# Patient Record
Sex: Male | Born: 1985 | Race: Black or African American | Hispanic: No | Marital: Single | Smoking: Current every day smoker
Health system: Southern US, Community
[De-identification: ages and names within clinical notes are randomized; demographics above are authoritative.]

---

## 2018-12-21 ENCOUNTER — Emergency Department (HOSPITAL_COMMUNITY): Payer: HRSA Program

## 2018-12-21 ENCOUNTER — Emergency Department (HOSPITAL_COMMUNITY)
Admission: EM | Admit: 2018-12-21 | Discharge: 2018-12-21 | Disposition: A | Discharge status: Home / Self Care | Payer: HRSA Program | Attending: Emergency Medicine | Admitting: Emergency Medicine

## 2018-12-21 ENCOUNTER — Encounter (HOSPITAL_COMMUNITY): Payer: Self-pay

## 2018-12-21 ENCOUNTER — Other Ambulatory Visit: Payer: Self-pay

## 2018-12-21 DIAGNOSIS — M545 Low back pain: Secondary | ICD-10-CM | POA: Diagnosis not present

## 2018-12-21 DIAGNOSIS — R509 Fever, unspecified: Secondary | ICD-10-CM | POA: Diagnosis not present

## 2018-12-21 DIAGNOSIS — M791 Myalgia, unspecified site: Secondary | ICD-10-CM | POA: Diagnosis present

## 2018-12-21 DIAGNOSIS — Z79899 Other long term (current) drug therapy: Secondary | ICD-10-CM | POA: Diagnosis not present

## 2018-12-21 DIAGNOSIS — B349 Viral infection, unspecified: Secondary | ICD-10-CM | POA: Diagnosis not present

## 2018-12-21 DIAGNOSIS — Z20828 Contact with and (suspected) exposure to other viral communicable diseases: Secondary | ICD-10-CM | POA: Insufficient documentation

## 2018-12-21 LAB — URINALYSIS, ROUTINE W REFLEX MICROSCOPIC
Bacteria, UA: NONE SEEN
Bilirubin Urine: NEGATIVE
Glucose, UA: NEGATIVE mg/dL
Hgb urine dipstick: NEGATIVE
Ketones, ur: NEGATIVE mg/dL
Leukocytes,Ua: NEGATIVE
Nitrite: NEGATIVE
Protein, ur: 100 mg/dL — AB
Specific Gravity, Urine: 1.019 (ref 1.005–1.030)
pH: 5 (ref 5.0–8.0)

## 2018-12-21 MED ORDER — ACETAMINOPHEN 325 MG PO TABS
650.0000 mg | ORAL_TABLET | Freq: Once | ORAL | Status: AC
  Administered 2018-12-21: 01:00:00 650 mg via ORAL
  Filled 2018-12-21: qty 2

## 2018-12-21 NOTE — ED Provider Notes (Signed)
[facility] DEPT Provider Note   CSN: [id number] Arrival date & time: 12/21/18  0006    History   Chief Complaint Chief Complaint  Patient presents with  . possible covid    HPI Marco Snyder is a 33 y.o. male without significant past medical history, presenting to the emergency department with complaint of generalized body aches and fever since Tuesday of this week.  He states he has had generalized body aches, including his low back.  He has had chills, sore throat, diarrhea.  He has had increased urinary frequency, however has been drinking lots of water.  He denies cough, shortness of breath, vomiting, abdominal pain, loss of taste or smell, flank pain.  He has no known sick contacts or any contact with people that tested positive for COVID that he knows of.  He works at [employer].  He has been treating symptoms with TheraFlu.     The history is provided by the patient.    History reviewed. No pertinent past medical history.  There are no active problems to display for this patient.   History reviewed. No pertinent surgical history.      Home Medications    Prior to Admission medications   Medication Sig Start Date End Date Taking? Authorizing Provider  acetaminophen (TYLENOL) 500 MG tablet Take 1,000 mg by mouth every 6 (six) hours as needed for moderate pain.   Yes [provider]  buPROPion (WELLBUTRIN XL) 300 MG 24 hr tablet Take 300 mg by mouth daily.   Yes [provider]  diphenhydrAMINE-PE-APAP (THERAFLU WARMING RELIEF FLU) 12.5-5-325 MG/15ML LIQD Take 30 mLs by mouth every 6 (six) hours as needed (cough).   Yes [provider]  divalproex (DEPAKOTE ER) 500 MG 24 hr tablet Take 500 mg by mouth daily.   Yes [provider]  Phenyleph-Doxylamine-DM-APAP (NYQUIL SEVERE COLD/FLU) 5-6.25-10-325 MG/15ML LIQD Take 30 mLs by mouth at bedtime as needed (sleep).   Yes [provider]    Family  History No family history on file.  Social History Social History   Tobacco Use  . Smoking status: Not on file  Substance Use Topics  . Alcohol use: Not on file  . Drug use: Not on file     Allergies   Patient has no known allergies.   Review of Systems Review of Systems  Constitutional: Positive for chills and fever.  Respiratory: Negative for cough and shortness of breath.   Gastrointestinal: Positive for diarrhea. Negative for abdominal pain and vomiting.  Genitourinary: Positive for frequency. Negative for dysuria, flank pain and hematuria.  Musculoskeletal: Positive for myalgias (Generalized). Negative for neck pain and neck stiffness.  All other systems reviewed and are negative.    Physical Exam Updated Vital Signs BP 111/78   Pulse 73   Temp (!) 100.5 F (38.1 C) (Oral)   Resp 19   Ht 5\' 10"  (1.778 m)   Wt 79.4 kg   SpO2 96%   BMI 25.11 kg/m   Physical Exam Vitals signs and nursing note reviewed.  Constitutional:      General: He is not in acute distress.    Appearance: He is well-developed. He is not ill-appearing.     Comments: Patient is well-appearing, sitting up resting comfortably in bed.  HENT:     Head: Normocephalic and atraumatic.  Eyes:     Conjunctiva/sclera: Conjunctivae normal.  Neck:     Musculoskeletal: Normal range of motion.  Cardiovascular:  Rate and Rhythm: Normal rate and regular rhythm.  Pulmonary:     Effort: Pulmonary effort is normal. No respiratory distress.     Comments: Respirations are even and unlabored.  Patient is speaking in full sentences. Abdominal:     Palpations: Abdomen is soft.  Skin:    General: Skin is warm.  Neurological:     Mental Status: He is alert.  Psychiatric:        Behavior: Behavior normal.      ED Treatments / Results  Labs (all labs ordered are listed, but only abnormal results are displayed) Labs Reviewed  URINALYSIS, ROUTINE W REFLEX MICROSCOPIC - Abnormal; Notable for the  following components:      Result Value   Protein, ur 100 (*)    All other components within normal limits  NOVEL CORONAVIRUS, NAA (HOSPITAL ORDER, SEND-OUT TO REF LAB)    EKG None  Radiology Dg Chest Port 1 View  Result Date: 12/21/2018 CLINICAL DATA:  Chills, body aches, weakness EXAM: PORTABLE CHEST 1 VIEW COMPARISON:  None. FINDINGS: Heart and mediastinal contours are within normal limits. No focal opacities or effusions. No acute bony abnormality. IMPRESSION: No active disease. Electronically Signed   By: Charlett NoseKevin  Dover M.D.   On: 12/21/2018 01:01    Procedures Procedures (including critical care time)  Medications Ordered in ED Medications  acetaminophen (TYLENOL) tablet 650 mg (650 mg Oral Given 12/21/18 0123)     Initial Impression / Assessment and Plan / ED Course  I have reviewed the triage vital signs and the nursing notes.  Pertinent labs & imaging results that were available during my care of the patient were reviewed by me and considered in my medical decision making (see chart for details).    [name] was evaluated in Emergency Department on 12/21/2018 for the symptoms described in the history of present illness. He was evaluated in the context of the global COVID-19 pandemic, which necessitated consideration that the patient might be at risk for infection with the SARS-CoV-2 virus that causes COVID-19. Institutional protocols and algorithms that pertain to the evaluation of patients at risk for COVID-19 are in a state of rapid change based on information released by regulatory bodies including the CDC and federal and state organizations. These policies and algorithms were followed during the patient's care in the ED.     Pt presenting with body aches, fever, sore throat since Tuesday. No cough or SOB. No abd pain. Pt is well-appearing and in no distress. VSS. UA neg for infxn. CXR neg for infiltrate. COVID swab sent with concern for possible COVID infection.  Recommend symptomatic management and home isolation.  Discussed results, findings, treatment and follow up. Patient advised of return precautions. Patient verbalized understanding and agreed with plan.  Final Clinical Impressions(s) / ED Diagnoses   Final diagnoses:  Viral illness    ED Discharge Orders    None       Clio Gerhart, [place] N, PA-C 12/21/18 0445    Paula LibraMolpus, John, MD 12/21/18 [phone]

## 2018-12-21 NOTE — ED Triage Notes (Signed)
Per EMS, Pt began having flu/Covid like symptoms starting last Tuesday. Pt endorsees chills/sweating, generalized body aches, headaches, generalized weakness, diarrhea, sore throat. Pt denies SOB, or vomiting. Pt also complaining of right flank pain, stating that he has had increased urination, and urine was dark in color.

## 2018-12-21 NOTE — Discharge Instructions (Addendum)
Please read instructions below.  Your chest xray and urine are normal. You can take tylenol or ibuprofen as needed for body aches, sore throat or fever.  Drink plenty of water.  Use saline nasal spray for congestion. Follow up with your primary care provider as needed if symptoms persist. You will need to isolate at home until you know your covid test results. Return to the ER for inability to swallow liquids, difficulty breathing, or new or worsening symptoms.      Person Under Monitoring Name: Marco Snyder  Location: [address] [place] [address]   Infection Prevention Recommendations for Individuals Confirmed to have, or Being Evaluated for, 2019 Novel Coronavirus (COVID-19) Infection Who Receive Care at Home  Individuals who are confirmed to have, or are being evaluated for, COVID-19 should follow the prevention steps below until a healthcare provider or local or state health department says they can return to normal activities.  Stay home except to get medical care You should restrict activities outside your home, except for getting medical care. Do not go to work, school, or public areas, and do not use public transportation or taxis.  Call ahead before visiting your doctor Before your medical appointment, call the healthcare provider and tell them that you have, or are being evaluated for, COVID-19 infection. This will help the healthcare providers office take steps to keep other people from getting infected. Ask your healthcare provider to call the local or state health department.  Monitor your symptoms Seek prompt medical attention if your illness is worsening (e.g., difficulty breathing). Before going to your medical appointment, call the healthcare provider and tell them that you have, or are being evaluated for, COVID-19 infection. Ask your healthcare provider to call the local or state health department.  Wear a facemask You should wear a  facemask that covers your nose and mouth when you are in the same room with other people and when you visit a healthcare provider. People who live with or visit you should also wear a facemask while they are in the same room with you.  Separate yourself from other people in your home As much as possible, you should stay in a different room from other people in your home. Also, you should use a separate bathroom, if available.  Avoid sharing household items You should not share dishes, drinking glasses, cups, eating utensils, towels, bedding, or other items with other people in your home. After using these items, you should wash them thoroughly with soap and water.  Cover your coughs and sneezes Cover your mouth and nose with a tissue when you cough or sneeze, or you can cough or sneeze into your sleeve. Throw used tissues in a lined trash can, and immediately wash your hands with soap and water for at least 20 seconds or use an alcohol-based hand rub.  Wash your [facility] your hands often and thoroughly with soap and water for at least 20 seconds. You can use an alcohol-based hand sanitizer if soap and water are not available and if your hands are not visibly dirty. Avoid touching your eyes, nose, and mouth with unwashed hands.   Prevention Steps for Caregivers and Household Members of Individuals Confirmed to have, or Being Evaluated for, COVID-19 Infection Being Cared for in the Home  If you live with, or provide care at home for, a person confirmed to have, or being evaluated for, COVID-19 infection please follow these guidelines to prevent infection:  Follow healthcare  providers instructions Make sure that you understand and can help the patient follow any healthcare provider instructions for all care.  Provide for the patients basic needs You should help the patient with basic needs in the home and provide support for getting groceries, prescriptions, and other personal  needs.  Monitor the patients symptoms If they are getting sicker, call his or her medical provider and tell them that the patient has, or is being evaluated for, COVID-19 infection. This will help the healthcare providers office take steps to keep other people from getting infected. Ask the healthcare provider to call the local or state health department.  Limit the number of people who have contact with the patient If possible, have only one caregiver for the patient. Other household members should stay in another home or place of residence. If this is not possible, they should stay in another room, or be separated from the patient as much as possible. Use a separate bathroom, if available. Restrict visitors who do not have an essential need to be in the home.  Keep older adults, very young children, and other sick people away from the patient Keep older adults, very young children, and those who have compromised immune systems or chronic health conditions away from the patient. This includes people with chronic heart, lung, or kidney conditions, diabetes, and cancer.  Ensure good ventilation Make sure that shared spaces in the home have good air flow, such as from an air conditioner or an opened window, weather permitting.  Wash your hands often Wash your hands often and thoroughly with soap and water for at least 20 seconds. You can use an alcohol based hand sanitizer if soap and water are not available and if your hands are not visibly dirty. Avoid touching your eyes, nose, and mouth with unwashed hands. Use disposable paper towels to dry your hands. If not available, use dedicated cloth towels and replace them when they become wet.  Wear a facemask and gloves Wear a disposable facemask at all times in the room and gloves when you touch or have contact with the patients blood, body fluids, and/or secretions or excretions, such as sweat, saliva, sputum, nasal mucus, vomit, urine, or  feces.  Ensure the mask fits over your nose and mouth tightly, and do not touch it during use. Throw out disposable facemasks and gloves after using them. Do not reuse. Wash your hands immediately after removing your facemask and gloves. If your personal clothing becomes contaminated, carefully remove clothing and launder. Wash your hands after handling contaminated clothing. Place all used disposable facemasks, gloves, and other waste in a lined container before disposing them with other household waste. Remove gloves and wash your hands immediately after handling these items.  Do not share dishes, glasses, or other household items with the patient Avoid sharing household items. You should not share dishes, drinking glasses, cups, eating utensils, towels, bedding, or other items with a patient who is confirmed to have, or being evaluated for, COVID-19 infection. After the person uses these items, you should wash them thoroughly with soap and water.  Wash laundry thoroughly Immediately remove and wash clothes or bedding that have blood, body fluids, and/or secretions or excretions, such as sweat, saliva, sputum, nasal mucus, vomit, urine, or feces, on them. Wear gloves when handling laundry from the patient. Read and follow directions on labels of laundry or clothing items and detergent. In general, wash and dry with the warmest temperatures recommended on the label.  Clean all  areas the individual has used often Clean all touchable surfaces, such as counters, tabletops, doorknobs, bathroom fixtures, toilets, phones, keyboards, tablets, and bedside tables, every day. Also, clean any surfaces that may have blood, body fluids, and/or secretions or excretions on them. Wear gloves when cleaning surfaces the patient has come in contact with. Use a diluted bleach solution (e.g., dilute bleach with 1 part bleach and 10 parts water) or a household disinfectant with a label that says EPA-registered for  coronaviruses. To make a bleach solution at home, add 1 tablespoon of bleach to 1 quart (4 cups) of water. For a larger supply, add  cup of bleach to 1 gallon (16 cups) of water. Read labels of cleaning products and follow recommendations provided on product labels. Labels contain instructions for safe and effective use of the cleaning product including precautions you should take when applying the product, such as wearing gloves or eye protection and making sure you have good ventilation during use of the product. Remove gloves and wash hands immediately after cleaning.  Monitor yourself for signs and symptoms of illness Caregivers and household members are considered close contacts, should monitor their health, and will be asked to limit movement outside of the home to the extent possible. Follow the monitoring steps for close contacts listed on the symptom monitoring form.   ? If you have additional questions, contact your local health department or call the epidemiologist on call at [phone] (available 24/7). ? This guidance is subject to change. For the most up-to-date guidance from [facility], please refer to their website: TripMetro.huhttps://www.cdc.gov/coronavirus/2019-ncov/hcp/guidance-prevent-spread.html

## 2018-12-22 LAB — NOVEL CORONAVIRUS, NAA (HOSP ORDER, SEND-OUT TO REF LAB; TAT 18-24 HRS): SARS-CoV-2, NAA: NOT DETECTED

## 2020-08-21 ENCOUNTER — Ambulatory Visit (HOSPITAL_COMMUNITY): Admission: EM | Admit: 2020-08-21 | Discharge: 2020-08-21 | Discharge status: Against Medical Advice | Payer: Self-pay

## 2020-08-21 ENCOUNTER — Other Ambulatory Visit: Payer: Self-pay

## 2020-11-26 ENCOUNTER — Encounter (HOSPITAL_COMMUNITY): Payer: Self-pay

## 2020-11-26 ENCOUNTER — Ambulatory Visit (HOSPITAL_COMMUNITY)
Admission: EM | Admit: 2020-11-26 | Discharge: 2020-11-26 | Disposition: A | Discharge status: Home / Self Care | Payer: Self-pay | Attending: Emergency Medicine | Admitting: Emergency Medicine

## 2020-11-26 ENCOUNTER — Other Ambulatory Visit: Payer: Self-pay

## 2020-11-26 DIAGNOSIS — L03011 Cellulitis of right finger: Secondary | ICD-10-CM

## 2020-11-26 DIAGNOSIS — R21 Rash and other nonspecific skin eruption: Secondary | ICD-10-CM

## 2020-11-26 MED ORDER — DOXYCYCLINE HYCLATE 100 MG PO CAPS: 100.0000 mg | ORAL_CAPSULE | Freq: Two times a day (BID) | ORAL | 0 refills | Status: AC

## 2020-11-26 MED ORDER — TRIAMCINOLONE ACETONIDE 0.1 % EX CREA: 1.0000 "application " | TOPICAL_CREAM | Freq: Two times a day (BID) | CUTANEOUS | 0 refills | Status: AC

## 2020-11-26 NOTE — ED Triage Notes (Signed)
Pt reports swelling and pain in the right pinky finger x 1 week. Reports he had some pus came out after he drained yesterday.   Pt reports rash in the back x 6 months.

## 2020-11-26 NOTE — ED Provider Notes (Signed)
[facility]    CSN: [id number] Arrival date & time: 11/26/20  1224      History   Chief Complaint Chief Complaint  Patient presents with   finger problem    HPI Marco Snyder is a 35 y.o. male.   Patient here for evaluation of right pinky finger swelling and redness that has been ongoing for the past week.  Reports getting up and getting some drainage yesterday but swelling was worse today.  Has not tried any other OTC medications or treatments.  Also reports rash on his back that has been ongoing for the past 6 months.  Reports rash is itchy but denies any pain.  Denies any trauma, injury, or other precipitating event.  Denies any specific alleviating or aggravating factors.  Denies any fevers, chest pain, shortness of breath, N/V/D, numbness, tingling, weakness, abdominal pain, or headaches.     The history is provided by the patient.   History reviewed. No pertinent past medical history.  There are no problems to display for this patient.   History reviewed. No pertinent surgical history.     Home Medications    Prior to Admission medications   Medication Sig Start Date End Date Taking? Authorizing Provider  doxycycline (VIBRAMYCIN) 100 MG capsule Take 1 capsule (100 mg total) by mouth 2 (two) times daily. 11/26/20  Yes Ivette Loyal, NP  triamcinolone cream (KENALOG) 0.1 % Apply 1 application topically 2 (two) times daily. 11/26/20  Yes Ivette Loyal, NP  acetaminophen (TYLENOL) 500 MG tablet Take 1,000 mg by mouth every 6 (six) hours as needed for moderate pain.    [provider]  buPROPion (WELLBUTRIN XL) 300 MG 24 hr tablet Take 300 mg by mouth daily.    [provider]  diphenhydrAMINE-PE-APAP (THERAFLU WARMING RELIEF FLU) 12.5-5-325 MG/15ML LIQD Take 30 mLs by mouth every 6 (six) hours as needed (cough).    [provider]  divalproex (DEPAKOTE ER) 500 MG 24 hr tablet Take 500 mg by mouth daily.    [provider]   Phenyleph-Doxylamine-DM-APAP (NYQUIL SEVERE COLD/FLU) 5-6.25-10-325 MG/15ML LIQD Take 30 mLs by mouth at bedtime as needed (sleep).    [provider]    Family History Family History  Problem Relation Age of Onset   Cancer Mother    Healthy Father     Social History Social History   Tobacco Use   Smoking status: Every Day    Packs/day: 1.00    Pack years: 0.00    Types: Cigarettes   Smokeless tobacco: Never  Vaping Use   Vaping Use: Never used  Substance Use Topics   Alcohol use: Yes   Drug use: Yes    Frequency: 7.0 times per week    Types: Marijuana     Allergies   Patient has no known allergies.   Review of Systems Review of Systems  Skin:  Positive for rash and wound.  All other systems reviewed and are negative.   Physical Exam Triage Vital Signs ED Triage Vitals  Enc Vitals Group     BP 11/26/20 1340 121/76     Pulse --      Resp 11/26/20 1340 16     Temp 11/26/20 1340 98.6 F (37 C)     Temp Source 11/26/20 1340 Oral     SpO2 11/26/20 1340 100 %     Weight --      Height --      Head Circumference --  Peak Flow --      Pain Score 11/26/20 1337 9     Pain Loc --      Pain Edu? --      Excl. in GC? --    No data found.  Updated Vital Signs BP 121/76 (BP Location: Right Arm)   Temp 98.6 F (37 C) (Oral)   Resp 16   SpO2 100%   Visual Acuity Right Eye Distance:   Left Eye Distance:   Bilateral Distance:    Right Eye Near:   Left Eye Near:    Bilateral Near:     Physical Exam Vitals and nursing note reviewed.  Constitutional:      General: He is not in acute distress.    Appearance: Normal appearance. He is not ill-appearing, toxic-appearing or diaphoretic.  HENT:     Head: Normocephalic and atraumatic.  Eyes:     Conjunctiva/sclera: Conjunctivae normal.  Cardiovascular:     Rate and Rhythm: Normal rate.     Pulses: Normal pulses.  Pulmonary:     Effort: Pulmonary effort is normal.  Abdominal:     General:  Abdomen is flat.  Musculoskeletal:        General: Normal range of motion.     Cervical back: Normal range of motion.  Skin:    General: Skin is warm and dry.     Findings: Abscess (right little finger with redness and swelling around nail) and rash present. Rash is vesicular (diffuse across back).  Neurological:     General: No focal deficit present.     Mental Status: He is alert and oriented to person, place, and time.  Psychiatric:        Mood and Affect: Mood normal.   Back:    Left little finger:     UC Treatments / Results  Labs (all labs ordered are listed, but only abnormal results are displayed) Labs Reviewed - No data to display  EKG   Radiology No results found.  Procedures Procedures (including critical care time)  Medications Ordered in UC Medications - No data to display  Initial Impression / Assessment and Plan / UC Course  I have reviewed the triage vital signs and the nursing notes.  Pertinent labs & imaging results that were available during my care of the patient were reviewed by me and considered in my medical decision making (see chart for details).    Assessment negative for red flags or concerns.  Paronychia of right little finger.  Doxycycline twice daily for the next 10 days.  Soak finger in warm water or Epsom salt soaks 3-4 times a day.  Patient instructed to return for any worsening symptoms such as worsening redness, swelling, red streaks, or development of fevers. Rash and nonspecific skin eruption of the back.  Apply Kenalog cream twice a day as needed for itching.  May use emollient lotion such as CeraVe, Cetaphil, or Aquaphor.  Recommend applying lotions to wet skin and avoiding hot water when showering or bathing.  Follow-up with primary care as needed. Final Clinical Impressions(s) / UC Diagnoses   Final diagnoses:  Paronychia of finger of right hand  Rash and nonspecific skin eruption     Discharge Instructions      For your  finger: Take the doxycycline 1 pill twice a day for the next 10 days. Soak your finger in warm salt water or in Epsom salt soak 3-4 times a day. If the swelling or redness gets any worse please  return for reevaluation.  For your back: Apply the Kenalog cream twice a day as needed for itching.  If rash does not improve he may return for reevaluation.   Return or go to the Emergency Department if symptoms worsen or do not improve in the next few days.      ED Prescriptions     Medication Sig Dispense Auth. Provider   doxycycline (VIBRAMYCIN) 100 MG capsule Take 1 capsule (100 mg total) by mouth 2 (two) times daily. 20 capsule Ivette Loyal, NP   triamcinolone cream (KENALOG) 0.1 % Apply 1 application topically 2 (two) times daily. 30 g Ivette Loyal, NP      PDMP not reviewed this encounter.   Ivette Loyal, NP 11/26/20 1451

## 2020-11-26 NOTE — Discharge Instructions (Addendum)
For your finger: Take the doxycycline 1 pill twice a day for the next 10 days. Soak your finger in warm salt water or in Epsom salt soak 3-4 times a day. If the swelling or redness gets any worse please return for reevaluation.  For your back: Apply the Kenalog cream twice a day as needed for itching.  If rash does not improve he may return for reevaluation.   Return or go to the Emergency Department if symptoms worsen or do not improve in the next few days.

## 2021-03-05 IMAGING — DX PORTABLE CHEST - 1 VIEW
1 series · 1 of 1 positions shown · non-contrast
Comparison: None.

CLINICAL DATA: Chills, body aches, weakness

EXAM:
PORTABLE CHEST 1 VIEW

[chest ap]
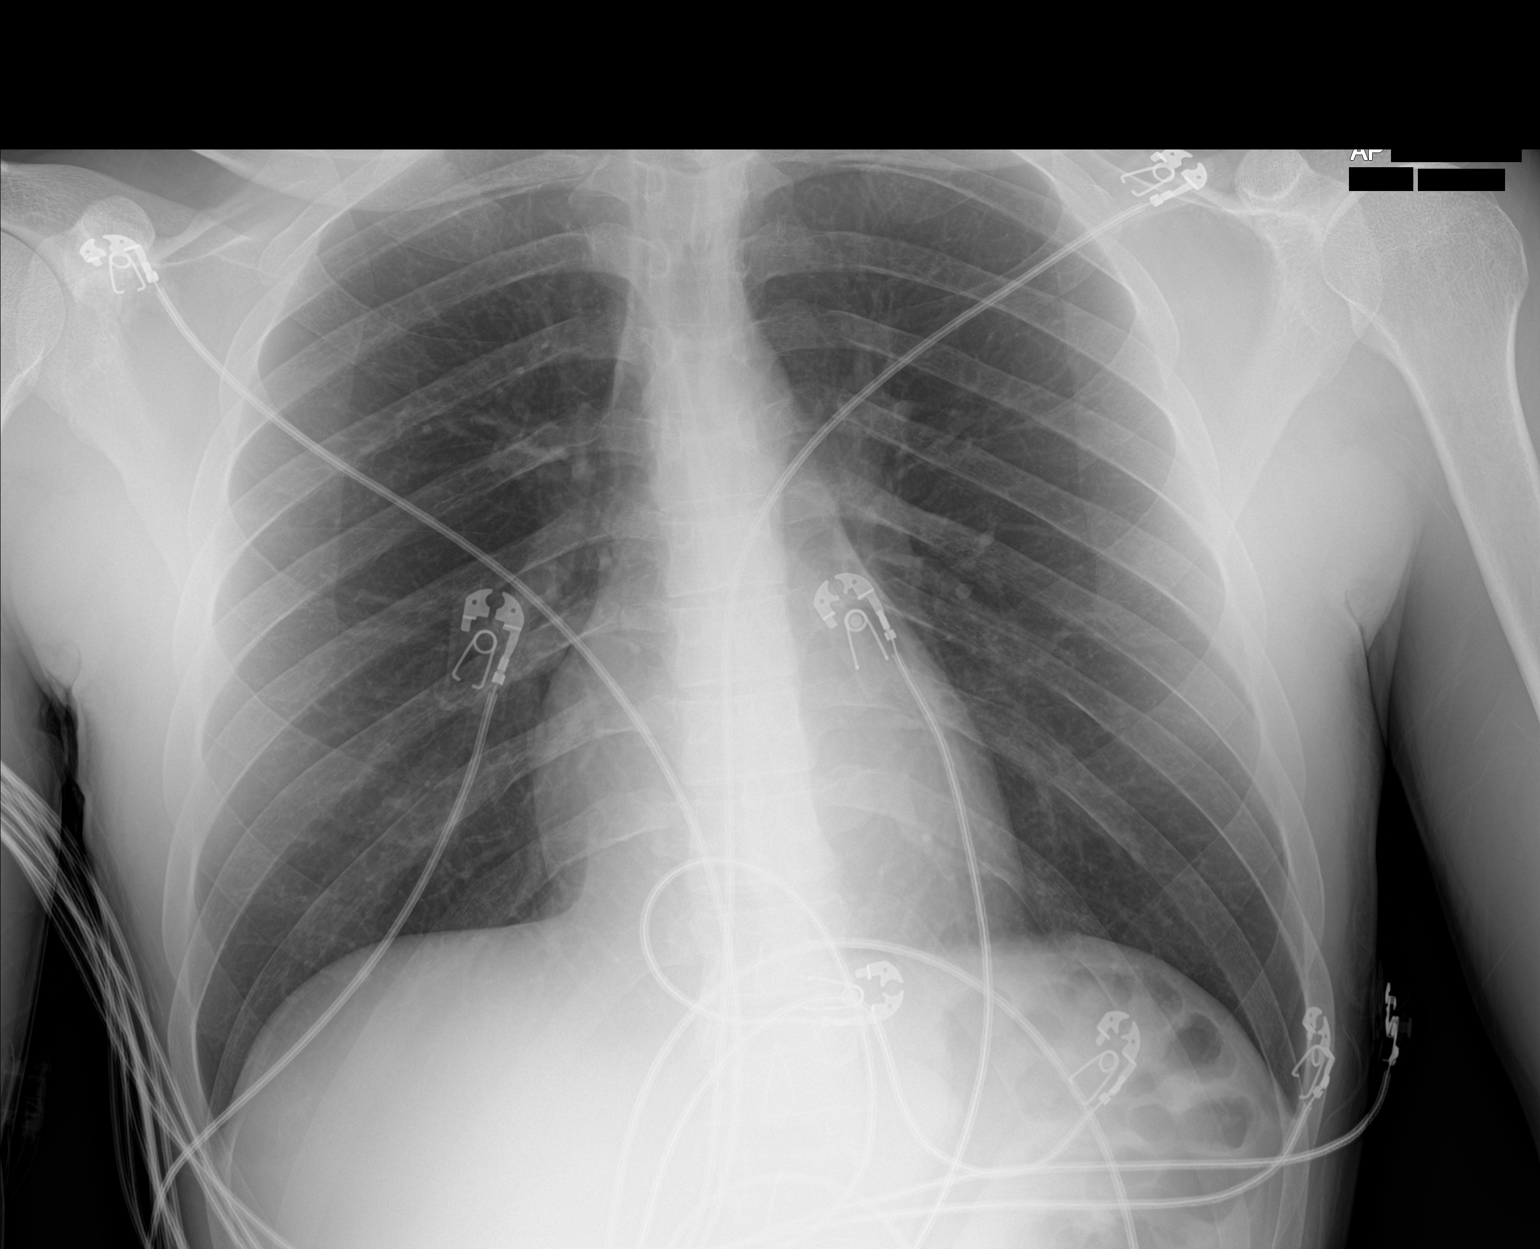

[1 of 1 positions shown; findings below may reference images not displayed]

FINDINGS: Heart and mediastinal contours are within normal limits. No focal
opacities or effusions. No acute bony abnormality.
IMPRESSION: No active disease.

## 2021-04-25 ENCOUNTER — Encounter (HOSPITAL_COMMUNITY): Payer: Self-pay | Admitting: Emergency Medicine

## 2021-04-25 ENCOUNTER — Other Ambulatory Visit: Payer: Self-pay

## 2021-04-25 ENCOUNTER — Ambulatory Visit (HOSPITAL_COMMUNITY)
Admission: EM | Admit: 2021-04-25 | Discharge: 2021-04-25 | Disposition: A | Discharge status: Home / Self Care | Payer: Medicaid Other | Attending: Emergency Medicine | Admitting: Emergency Medicine

## 2021-04-25 DIAGNOSIS — J111 Influenza due to unidentified influenza virus with other respiratory manifestations: Secondary | ICD-10-CM

## 2021-04-25 MED ORDER — CYCLOBENZAPRINE HCL 5 MG PO TABS: 5.0000 mg | ORAL_TABLET | Freq: Two times a day (BID) | ORAL | 0 refills | Status: AC

## 2021-04-25 MED ORDER — OSELTAMIVIR PHOSPHATE 75 MG PO CAPS: 75.0000 mg | ORAL_CAPSULE | Freq: Two times a day (BID) | ORAL | 0 refills | Status: AC

## 2021-04-25 MED ORDER — IBUPROFEN 800 MG PO TABS: 800.0000 mg | ORAL_TABLET | Freq: Three times a day (TID) | ORAL | 0 refills | Status: AC

## 2021-04-25 NOTE — ED Provider Notes (Signed)
[facility]    CSN: [id number] Arrival date & time: 04/25/21  7209      History   Chief Complaint Chief Complaint  Patient presents with   Cough   Generalized Body Aches   Chills    HPI Marco Snyder is a 35 y.o. male.   Patient presents with chills, fever, nasal congestion, rhinorrhea, nonproductive cough and intermittent generalized headaches for 3 days. Has attempted use of aspirin, which is helpful. Known sick contacts. Poor appetite but tolerating fluids.  Denies ear pain, sneezing, sore throat, shortness of breath, wheezing, abdominal pain, nausea, vomiting, diarrhea.  History reviewed. No pertinent past medical history.  There are no problems to display for this patient.   History reviewed. No pertinent surgical history.     Home Medications    Prior to Admission medications   Medication Sig Start Date End Date Taking? Authorizing Provider  acetaminophen (TYLENOL) 500 MG tablet Take 1,000 mg by mouth every 6 (six) hours as needed for moderate pain.    [provider]  buPROPion (WELLBUTRIN XL) 300 MG 24 hr tablet Take 300 mg by mouth daily.    [provider]  diphenhydrAMINE-PE-APAP (THERAFLU WARMING RELIEF FLU) 12.5-5-325 MG/15ML LIQD Take 30 mLs by mouth every 6 (six) hours as needed (cough).    [provider]  divalproex (DEPAKOTE ER) 500 MG 24 hr tablet Take 500 mg by mouth daily.    [provider]  doxycycline (VIBRAMYCIN) 100 MG capsule Take 1 capsule (100 mg total) by mouth 2 (two) times daily. 11/26/20   Ivette Loyal, NP  Phenyleph-Doxylamine-DM-APAP (NYQUIL SEVERE COLD/FLU) 5-6.25-10-325 MG/15ML LIQD Take 30 mLs by mouth at bedtime as needed (sleep).    [provider]  triamcinolone cream (KENALOG) 0.1 % Apply 1 application topically 2 (two) times daily. 11/26/20   Ivette Loyal, NP    Family History Family History  Problem Relation Age of Onset   Cancer Mother    Healthy Father      Social History Social History   Tobacco Use   Smoking status: Every Day    Packs/day: 1.00    Types: Cigarettes   Smokeless tobacco: Never  Vaping Use   Vaping Use: Never used  Substance Use Topics   Alcohol use: Yes   Drug use: Yes    Frequency: 7.0 times per week    Types: Marijuana     Allergies   Patient has no known allergies.   Review of Systems Review of Systems  Constitutional:  Positive for chills and fever. Negative for activity change, appetite change, diaphoresis, fatigue and unexpected weight change.  HENT:  Positive for congestion and rhinorrhea. Negative for dental problem, drooling, ear discharge, ear pain, facial swelling, hearing loss, mouth sores, nosebleeds, postnasal drip, sinus pressure, sinus pain, sneezing, sore throat, tinnitus, trouble swallowing and voice change.   Respiratory:  Positive for cough. Negative for apnea, choking, chest tightness, shortness of breath, wheezing and stridor.   Cardiovascular: Negative.   Gastrointestinal:  Positive for nausea. Negative for abdominal distention, abdominal pain, anal bleeding, blood in stool, constipation, diarrhea, rectal pain and vomiting.  Skin: Negative.   Neurological:  Positive for headaches. Negative for dizziness, tremors, seizures, syncope, facial asymmetry, speech difficulty, weakness, light-headedness and numbness.    Physical Exam Triage Vital Signs ED Triage Vitals  Enc Vitals Group     BP 04/25/21 0946 127/81     Pulse Rate 04/25/21 0946 67     Resp 04/25/21  0946 16     Temp 04/25/21 0946 98.3 F (36.8 C)     Temp Source 04/25/21 0946 Oral     SpO2 04/25/21 0946 98 %     Weight --      Height --      Head Circumference --      Peak Flow --      Pain Score 04/25/21 0945 7     Pain Loc --      Pain Edu? --      Excl. in GC? --    No data found.  Updated Vital Signs BP 127/81 (BP Location: Left Arm)   Pulse 67   Temp 98.3 F (36.8 C) (Oral)   Resp 16   SpO2 98%    Visual Acuity Right Eye Distance:   Left Eye Distance:   Bilateral Distance:    Right Eye Near:   Left Eye Near:    Bilateral Near:     Physical Exam Constitutional:      Appearance: Normal appearance.  HENT:     Head: Normocephalic.     Right Ear: Tympanic membrane, ear canal and external ear normal.     Left Ear: Tympanic membrane, ear canal and external ear normal.     Nose: Congestion and rhinorrhea present.     Mouth/Throat:     Mouth: Mucous membranes are moist.     Pharynx: Posterior oropharyngeal erythema present.  Eyes:     Extraocular Movements: Extraocular movements intact.  Cardiovascular:     Rate and Rhythm: Normal rate and regular rhythm.     Pulses: Normal pulses.     Heart sounds: Normal heart sounds.  Pulmonary:     Effort: Pulmonary effort is normal.     Breath sounds: Normal breath sounds.  Musculoskeletal:     Cervical back: Normal range of motion.  Lymphadenopathy:     Cervical: Cervical adenopathy present.  Skin:    General: Skin is warm and dry.  Neurological:     Mental Status: He is alert and oriented to person, place, and time. Mental status is at baseline.  Psychiatric:        Mood and Affect: Mood normal.        Behavior: Behavior normal.     UC Treatments / Results  Labs (all labs ordered are listed, but only abnormal results are displayed) Labs Reviewed - No data to display  EKG   Radiology No results found.  Procedures Procedures (including critical care time)  Medications Ordered in UC Medications - No data to display  Initial Impression / Assessment and Plan / UC Course  I have reviewed the triage vital signs and the nursing notes.  Pertinent labs & imaging results that were available during my care of the patient were reviewed by me and considered in my medical decision making (see chart for details).  Influenza-like illness  1.  Tamiflu 75 mg twice daily for 5 days 2.  Ibuprofen 800 mg 3 times daily as  needed 3.  Flexeril 5 mg twice daily as needed 4.  Over-the-counter medication for remaining symptom management 5.  Urgent cares follow-up as needed 6.  Work note given Final Clinical Impressions(s) / UC Diagnoses   Final diagnoses:  None   Discharge Instructions   None    ED Prescriptions   None    PDMP not reviewed this encounter.   Valinda Hoar, NP 04/25/21 1005

## 2021-04-25 NOTE — ED Triage Notes (Signed)
Pt c/o body aches, chills, cough for a couple days. Reports woke up in sweat today.

## 2021-04-25 NOTE — Discharge Instructions (Signed)
They are most likely being caused by influenza A, your symptoms are consistent with the current presentation, influenza is a virus and will steadily improve with time  You may take Tamiflu twice daily for the next 5 days, if you are able to get this medication from the pharmacy then continue supportive treatment, symptoms will improve as the virus works as well after system whether or not you take this medication    You can take Tylenol and/or Ibuprofen as needed for fever reduction and pain relief.   For cough: honey 1/2 to 1 teaspoon (you can dilute the honey in water or another fluid).  You can also use guaifenesin and dextromethorphan for cough. You can use a humidifier for chest congestion and cough.  If you don't have a humidifier, you can sit in the bathroom with the hot shower running.      For sore throat: try warm salt water gargles, cepacol lozenges, throat spray, warm tea or water with lemon/honey, popsicles or ice, or OTC cold relief medicine for throat discomfort.   For congestion: take a daily anti-histamine like Zyrtec, Claritin, and a oral decongestant, such as pseudoephedrine.  You can also use Flonase 1-2 sprays in each nostril daily.   It is important to stay hydrated: drink plenty of fluids (water, gatorade/powerade/pedialyte, juices, or teas) to keep your throat moisturized and help further relieve irritation/discomfort.    

## 2022-04-14 ENCOUNTER — Encounter (HOSPITAL_COMMUNITY): Payer: Self-pay | Admitting: *Deleted

## 2022-04-14 ENCOUNTER — Ambulatory Visit (HOSPITAL_COMMUNITY)
Admission: EM | Admit: 2022-04-14 | Discharge: 2022-04-14 | Disposition: A | Discharge status: Home / Self Care | Payer: Self-pay | Attending: Internal Medicine | Admitting: Internal Medicine

## 2022-04-14 DIAGNOSIS — R0981 Nasal congestion: Secondary | ICD-10-CM

## 2022-04-14 DIAGNOSIS — J069 Acute upper respiratory infection, unspecified: Secondary | ICD-10-CM

## 2022-04-14 DIAGNOSIS — R197 Diarrhea, unspecified: Secondary | ICD-10-CM

## 2022-04-14 DIAGNOSIS — J029 Acute pharyngitis, unspecified: Secondary | ICD-10-CM

## 2022-04-14 DIAGNOSIS — Z1152 Encounter for screening for COVID-19: Secondary | ICD-10-CM | POA: Insufficient documentation

## 2022-04-14 MED ORDER — BENZONATATE 100 MG PO CAPS: 100.0000 mg | ORAL_CAPSULE | Freq: Three times a day (TID) | ORAL | 0 refills | Status: AC

## 2022-04-14 NOTE — ED Provider Notes (Signed)
[facility]    CSN: [id number] Arrival date & time: 04/14/22  1504      History   Chief Complaint Chief Complaint  Patient presents with   Sore Throat   Cough   Nasal Congestion   Chills   Diarrhea    HPI Marco Snyder is a 36 y.o. male.   Patient presents to urgent care for evaluation of sore throat, nasal congestion, body aches, chills, cough, fatigue that started 3 days ago. Cough is dry and worse at nighttime.  Sore throat is worsened with swallowing.  Denies headache, vision changes, and dizziness.  Reports chills but unknown highest temp at home. Denies shortness of breath, chest pain, nausea, vomiting, abdominal pain, and eye drainage.  Denies blood or mucus in the stool.  No recent antibiotic or steroid use.  Patient's 102 year old child has been sick with similar symptoms 1 week ago. Denies history of asthma or chronic respiratory problems. Patient smokes cigarettes and marijuana daily but denies other drug use.  Has attempted use of Alka-Seltzer plus cold and flu medication prior to arrival at urgent care for relief of symptoms with some relief.  He states his cough became more productive after using Alka-Seltzer cold and flu as the medication helped to break up some of his mucus.   Sore Throat  Cough Diarrhea   History reviewed. No pertinent past medical history.  There are no problems to display for this patient.   History reviewed. No pertinent surgical history.     Home Medications    Prior to Admission medications   Medication Sig Start Date End Date Taking? Authorizing Provider  benzonatate (TESSALON) 100 MG capsule Take 1 capsule (100 mg total) by mouth every 8 (eight) hours. 04/14/22  Yes Carlisle Beers, FNP  acetaminophen (TYLENOL) 500 MG tablet Take 1,000 mg by mouth every 6 (six) hours as needed for moderate pain.    [provider]  buPROPion (WELLBUTRIN XL) 300 MG 24 hr tablet Take 300 mg by mouth daily.     [provider]  cyclobenzaprine (FLEXERIL) 5 MG tablet Take 1 tablet (5 mg total) by mouth in the morning and at bedtime. 04/25/21   White, Elita Boone, NP  diphenhydrAMINE-PE-APAP (THERAFLU WARMING RELIEF FLU) 12.5-5-325 MG/15ML LIQD Take 30 mLs by mouth every 6 (six) hours as needed (cough).    [provider]  divalproex (DEPAKOTE ER) 500 MG 24 hr tablet Take 500 mg by mouth daily.    [provider]  doxycycline (VIBRAMYCIN) 100 MG capsule Take 1 capsule (100 mg total) by mouth 2 (two) times daily. 11/26/20   Ivette Loyal, NP  ibuprofen (ADVIL) 800 MG tablet Take 1 tablet (800 mg total) by mouth 3 (three) times daily. 04/25/21   Valinda Hoar, NP  oseltamivir (TAMIFLU) 75 MG capsule Take 1 capsule (75 mg total) by mouth every 12 (twelve) hours. 04/25/21   White, Elita Boone, NP  Phenyleph-Doxylamine-DM-APAP (NYQUIL SEVERE COLD/FLU) 5-6.25-10-325 MG/15ML LIQD Take 30 mLs by mouth at bedtime as needed (sleep).    [provider]  triamcinolone cream (KENALOG) 0.1 % Apply 1 application topically 2 (two) times daily. 11/26/20   Ivette Loyal, NP    Family History Family History  Problem Relation Age of Onset   Cancer Mother    Healthy Father     Social History Social History   Tobacco Use   Smoking status: Every Day    Packs/day: 1.00    Types:  Cigarettes   Smokeless tobacco: Never  Vaping Use   Vaping Use: Never used  Substance Use Topics   Alcohol use: Yes   Drug use: Yes    Frequency: 7.0 times per week    Types: Marijuana     Allergies   Patient has no known allergies.   Review of Systems Review of Systems  Respiratory:  Positive for cough.   Gastrointestinal:  Positive for diarrhea.  Per HPI   Physical Exam Triage Vital Signs ED Triage Vitals  Enc Vitals Group     BP 04/14/22 1638 130/78     Pulse Rate 04/14/22 1638 71     Resp 04/14/22 1638 18     Temp 04/14/22 1638 98.3 F (36.8 C)     Temp Source 04/14/22 1638 Oral      SpO2 04/14/22 1638 97 %     Weight --      Height --      Head Circumference --      Peak Flow --      Pain Score 04/14/22 1636 3     Pain Loc --      Pain Edu? --      Excl. in GC? --    No data found.  Updated Vital Signs BP 130/78 (BP Location: Right Arm)   Pulse 71   Temp 98.3 F (36.8 C) (Oral)   Resp 18   SpO2 97%   Visual Acuity Right Eye Distance:   Left Eye Distance:   Bilateral Distance:    Right Eye Near:   Left Eye Near:    Bilateral Near:     Physical Exam Vitals and nursing note reviewed.  Constitutional:      Appearance: He is not ill-appearing or toxic-appearing.  HENT:     Head: Normocephalic and atraumatic.     Right Ear: Hearing, tympanic membrane, ear canal and external ear normal.     Left Ear: Hearing, tympanic membrane, ear canal and external ear normal.     Nose: Congestion present.     Mouth/Throat:     Lips: Pink.     Mouth: Mucous membranes are moist.     Pharynx: Oropharynx is clear. Posterior oropharyngeal erythema present.     Tonsils: No tonsillar exudate or tonsillar abscesses.     Comments: Mild erythema to the posterior oropharynx present. Eyes:     General: Lids are normal. Vision grossly intact. Gaze aligned appropriately.     Extraocular Movements: Extraocular movements intact.     Conjunctiva/sclera: Conjunctivae normal.  Pulmonary:     Effort: Pulmonary effort is normal.  Abdominal:     General: Bowel sounds are normal.     Palpations: Abdomen is soft.     Tenderness: There is no abdominal tenderness. There is no right CVA tenderness, left CVA tenderness or guarding.  Musculoskeletal:     Cervical back: Neck supple.  Lymphadenopathy:     Cervical: Cervical adenopathy present.  Skin:    General: Skin is warm and dry.     Capillary Refill: Capillary refill takes less than 2 seconds.     Findings: No rash.  Neurological:     General: No focal deficit present.     Mental Status: He is alert and oriented to person,  place, and time. Mental status is at baseline.     Cranial Nerves: No dysarthria or facial asymmetry.  Psychiatric:        Mood and Affect: Mood normal.  Speech: Speech normal.        Behavior: Behavior normal.        Thought Content: Thought content normal.        Judgment: Judgment normal.      UC Treatments / Results  Labs (all labs ordered are listed, but only abnormal results are displayed) Labs Reviewed  SARS CORONAVIRUS 2 (TAT 6-24 HRS)    EKG   Radiology No results found.  Procedures Procedures (including critical care time)  Medications Ordered in UC Medications - No data to display  Initial Impression / Assessment and Plan / UC Course  I have reviewed the triage vital signs and the nursing notes.  Pertinent labs & imaging results that were available during my care of the patient were reviewed by me and considered in my medical decision making (see chart for details).   1.  Viral URI with cough Symptoms and physical exam consistent with a viral upper respiratory tract infection that will likely resolve with rest, fluids, and prescriptions for symptomatic relief. No indication for imaging today based on stable cardiopulmonary exam and hemodynamically stable vital signs. COVID-19 testing is pending.  We will call patient if this is positive.  Quarantine guidelines discussed. Currently on day 3 of symptoms and does qualify for antiviral therapy.   Tessalon pearles every 8 hours as needed for cough sent to pharmacy for symptomatic relief to be taken as prescribed.   May either keep taking Alka-Seltzer plus cold and flu or purchase guaifenesin (plain Mucinex) and take this instead to help with cough and nasal congestion.  May use ibuprofen/tylenol over the counter for body aches, fever/chills, and overall discomfort associated with viral illness. Nonpharmacologic interventions for symptom relief provided and after visit summary below.   Strict ED/urgent care  return precautions given.  Patient verbalizes understanding and agreement with plan.  Counseled patient regarding possible side effects and uses of all medications prescribed at today's visit.  Patient verbalizes understanding and agreement with plan.  All questions answered.  Patient discharged from urgent care in stable condition.        Final Clinical Impressions(s) / UC Diagnoses   Final diagnoses:  Viral URI with cough  Diarrhea, unspecified type  Sore throat  Nasal congestion     Discharge Instructions      You have a viral upper respiratory infection.  COVID-19 testing is pending. We will call you with results if positive. If your COVID test is positive, you must stay at home until day 6 of symptoms. On day 6, you may go out into public and go back to work, but you must wear a mask until day 11 of symptoms to prevent spread to others.  Take guaifenesin 1200mg   every 12 hours to thin your mucous so that you can get it out of your body easier with coughing/blowing your nose. Drink plenty of water while taking this medication so that it works well in your body (at least 8 cups a day).   Take tessalon pearles every 8 hours as needed for cough.  You may take tylenol 1,000mg  and ibuprofen 600mg  every 6 hours with food as needed for fever/chills, sore throat, aches/pains, and inflammation associated with viral illness. Take this with food to avoid stomach upset.    You may do salt water and baking soda gargles every 4 hours as needed for your throat pain.  Please put 1 teaspoon of salt and 1/2 teaspoon of baking soda in 8 ounces of warm water then  gargle and spit the water out. You may also put 1 tablespoon of honey in warm water and drink this to soothe your throat.  Place a humidifier in your room at night to help decrease dry air that can irritate your airway and cause you to have a sore throat and cough.  Please try to eat a well-balanced diet while you are sick so that your body gets  proper nutrition to heal.  If you develop any new or worsening symptoms, please return.  If your symptoms are severe, please go to the emergency room.  Follow-up with your primary care provider for further evaluation and management of your symptoms as well as ongoing wellness visits.  I hope you feel better!     ED Prescriptions     Medication Sig Dispense Auth. Provider   benzonatate (TESSALON) 100 MG capsule Take 1 capsule (100 mg total) by mouth every 8 (eight) hours. 21 capsule Carlisle BeersStanhope, Nolen Lindamood M, FNP      PDMP not reviewed this encounter.   Carlisle BeersStanhope, Kostantinos Tallman M, [place] 04/14/22 1823

## 2022-04-14 NOTE — ED Triage Notes (Signed)
Pt states he has cough, congestion, body aches, and diarrhea X 3 days. 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

## 2022-04-14 NOTE — Discharge Instructions (Signed)
You have a viral upper respiratory infection.  COVID-19 testing is pending. We will call you with results if positive. If your COVID test is positive, you must stay at home until day 6 of symptoms. On day 6, you may go out into public and go back to work, but you must wear a mask until day 11 of symptoms to prevent spread to others.  Take guaifenesin 1200mg  every 12 hours to thin your mucous so that you can get it out of your body easier with coughing/blowing your nose. Drink plenty of water while taking this medication so that it works well in your body (at least 8 cups a day).   Take tessalon pearles every 8 hours as needed for cough.  You may take tylenol 1,000mg and ibuprofen 600mg every 6 hours with food as needed for fever/chills, sore throat, aches/pains, and inflammation associated with viral illness. Take this with food to avoid stomach upset.    You may do salt water and baking soda gargles every 4 hours as needed for your throat pain.  Please put 1 teaspoon of salt and 1/2 teaspoon of baking soda in 8 ounces of warm water then gargle and spit the water out. You may also put 1 tablespoon of honey in warm water and drink this to soothe your throat.  Place a humidifier in your room at night to help decrease dry air that can irritate your airway and cause you to have a sore throat and cough.  Please try to eat a well-balanced diet while you are sick so that your body gets proper nutrition to heal.  If you develop any new or worsening symptoms, please return.  If your symptoms are severe, please go to the emergency room.  Follow-up with your primary care provider for further evaluation and management of your symptoms as well as ongoing wellness visits.  I hope you feel better!  

## 2022-04-15 LAB — SARS CORONAVIRUS 2 (TAT 6-24 HRS): SARS Coronavirus 2: NEGATIVE

## 2023-05-28 ENCOUNTER — Ambulatory Visit (HOSPITAL_COMMUNITY)
Admission: EM | Admit: 2023-05-28 | Discharge: 2023-05-28 | Disposition: A | Discharge status: Home / Self Care | Payer: No Payment, Other | Attending: Psychiatry | Admitting: Psychiatry

## 2023-05-28 DIAGNOSIS — F259 Schizoaffective disorder, unspecified: Secondary | ICD-10-CM | POA: Insufficient documentation

## 2023-05-28 DIAGNOSIS — F4323 Adjustment disorder with mixed anxiety and depressed mood: Secondary | ICD-10-CM

## 2023-05-28 DIAGNOSIS — F22 Delusional disorders: Secondary | ICD-10-CM

## 2023-05-28 MED ORDER — ARIPIPRAZOLE 5 MG PO TABS
5.0000 mg | ORAL_TABLET | Freq: Every day | ORAL | Status: DC
  Filled 2023-05-28: qty 7

## 2023-05-28 MED ORDER — ARIPIPRAZOLE 5 MG PO TABS: 5.0000 mg | ORAL_TABLET | Freq: Every day | ORAL | Status: AC

## 2023-05-28 NOTE — Discharge Instructions (Signed)
Discharge recommendations:   Medications: Patient is to take medications as prescribed. The patient or patient's guardian is to contact a medical professional and/or outpatient provider to address any new side effects that develop. The patient or the patient's guardian should update outpatient providers of any new medications and/or medication changes.    Outpatient Follow up: Please review list of outpatient resources for psychiatry and counseling. Please follow up with your primary care provider for all medical related needs.    Therapy: We recommend that patient participate in individual therapy to address mental health concerns.   Atypical antipsychotics: If you are prescribed an atypical antipsychotic, it is recommended that your height, weight, BMI, blood pressure, fasting lipid panel, and fasting blood sugar be monitored by your outpatient providers.  Safety:   The following safety precautions should be taken:   No sharp objects. This includes scissors, razors, scrapers, and putty knives.   Chemicals should be removed and locked up.   Medications should be removed and locked up.   Weapons should be removed and locked up. This includes firearms, knives and instruments that can be used to cause injury.   The patient should abstain from use of illicit substances/drugs and abuse of any medications.  If symptoms worsen or do not continue to improve or if the patient becomes actively suicidal or homicidal then it is recommended that the patient return to the closest hospital emergency department, the [facility], or call 911 for further evaluation and treatment. National Suicide Prevention Lifeline 1-800-SUICIDE or 567-265-1791.  About 988 988 offers 24/7 access to trained crisis counselors who can help people experiencing mental health-related distress. People can call or text 988 or chat 988lifeline.org for themselves or if they are worried about a  loved one who may need crisis support.   Based on what you have shared, a list of resources for outpatient therapy and psychiatry is provided below to get you started back on treatment.  It is imperative that you follow through with treatment within 5-7 days from the day of discharge to prevent any further risk to your safety or mental well-being.  You are not limited to the list provided.  In case of an urgent crisis, you may contact the Mobile Crisis Unit with [facility] at 1.562-460-1106.        Outpatient Services for Therapy and Medication Management for [facility] [address], [place], [address] [phone] phone  New Patient Assessment/Therapy Walk-ins Monday and Wednesday: 8am until slots are full. Every 1st and 2nd Friday: 1pm - 5pm  NO ASSESSMENT/THERAPY WALK-INS ON TUESDAYS OR THURSDAYS  New Patient Psychiatry/Medication Management Walk-ins Monday-Friday: 8am-11am  For all walk-ins, we ask that you arrive by 7:30am because patient will be seen in the order of arrival.  Availability is limited; therefore, you may not be seen on the same day that you walk-in.  Our goal is to serve and meet the needs of our community to the best of our ability.   [facility] [address] [place], [place], [address] [phone] phone  [facility] [address] [place], [place], [address] [phone] phone [phone] phone ([address], Anthem[employer]Elevance, [address], [address], [address], [address], Healthy [place], [place], [place], [address], [employer], [place], UHC, [employer], [place], Out of Network)  [employer], [place] [address] [place], [place], [address] [phone] phone (Monia Pouch, Anthem[employer]vance, [employer], [place],  [address], [place], [place], [place], [place], [employer],  [place], [place], [place], [facility])  [employer] 970 438 [address] [place], [place], [address] [phone] phone (Medicaid, ask about other insurance)  [facility] [address] [place], [place], [address] [phone] phone [phone] fax ([address] [place] , [place], [place], [place] Health Choice, UHC, [employer], Self-Pay)  [facility] [address] [place], [place], [address] [phone] phone ([address] Anthem/Elevance, [address], [address], [place], [employer], [place], [place], [place], [employer], [place], [place], [place], [facility])  [facility] - 6-8 MONTH WAIT FOR THERAPY; SOONER FOR MEDICATION MANAGEMENT [address], [address] [place], [place], [address] [phone] phone ([address] AmeriHealth [address] - [place], [address], [place], [place], Friday Health Plans, [address], BCBS Healthy [place], [place], [address], [place], [place], [place], [place], Tricare, UHC, [employer], [place])  [facility] [address], [address] [place], [place], [address] [phone] phone  [facility] [address], [address] [place], [place], [address] [phone] phone  [facility] [address] [place], [place], [address] [phone] phone  [facility] [place] - THERAPY ONLY [address] [place], [address] [phone] phone  [facility], [place] [address], [place], [address] [phone] phone  [facility] [address] [place], [place], [address] [phone] phone [phone] fax  [facility] [address] [place], [place], [address] [phone] phone [phone] fax  [facility] [phone] [address] [place], [place], [address] [phone] phone  Jovita Kussmaul [address] [place], [place], [address]  [phone] phone  [facility]   (Adults Only) [address] [place], [place], [address]  [phone] phone [phone] fax

## 2023-05-28 NOTE — Progress Notes (Signed)
   05/28/23 0736  [facility] Triage Screening (Walk-ins at [facility] only)  How Did You Hear About Us ? Self  What Is the Reason for Your Visit/Call Today? Marco Snyder presents to [facility] voluntarily unaccompanied. Pt states that he needs to get back in therapy and on medication. Pt states that he is diagnosis with schizophernia. Pt denies SI, AVH and alcohol use at this time. Pt states that he had HI thoughts on yesterday, but not at this moment. Pt admits to smoking a blunt of marijuana this morning.  How Long Has This Been Causing You Problems? > than 6 months  Have You Recently Had Any Thoughts About Hurting Yourself? No  Are You Planning to Commit Suicide/Harm Yourself At This time? No  Have you Recently Had Thoughts About Hurting Someone [name]? Yes  How long ago did you have thoughts of harming others? yesterday  Are You Planning To Harm Someone At This Time? No  Physical Abuse Denies  Verbal Abuse Denies  Sexual Abuse Denies  Exploitation of patient/patient's resources Yes, past (Comment);Yes, present (Comment)  Self-Neglect Denies  Are you currently experiencing any auditory, visual or other hallucinations? No  Have You Used Any Alcohol or Drugs in the Past 24 Hours? Yes  How long ago did you use Drugs or Alcohol? this morning  What Did You Use and How Much? marijuana - a blunt  Do you have any current medical co-morbidities that require immediate attention? No  Clinician description of patient physical appearance/behavior: casually dressed, calm, smiling, cooperative  What Do You Feel Would Help You the Most Today? Social Support;Treatment for Depression or other mood problem;Medication(s)  If access to [facility] was not available, would you have sought care in the Emergency Department? No  Determination of Need Routine (7 days)  Options For Referral Medication Management;Outpatient Therapy

## 2023-05-28 NOTE — ED Notes (Signed)
 Patient discharged by provider.

## 2023-05-28 NOTE — ED Provider Notes (Signed)
 Behavioral Health Urgent Care Medical Screening Exam  Patient Name: Marco Snyder MRN: [id number] Date of Evaluation: 05/28/23 Chief Complaint:  Requesting to be restarted on his psychiatric medications Diagnosis:  Final diagnoses:  Adjustment disorder with mixed anxiety and depressed mood  Paranoia (HCC)    History of Present illness: Marco Snyder is a 38 y.o. male patient presented to [facility] as a walk in unaccompanied requesting to be restarted on his psychiatric medications.  Marco Snyder, 38 y.o., male patient seen face to face by this provider, chart reviewed, and consulted with Dr. Merilee on 05/28/23.  Chart review is limited.  Patient reports a past psychiatric history of schizoaffective disorder, anxiety, and depression.  Past medication trials include Wellbutrin, Depakote, and Abilify .  Patient has resided in [place] and states this is where he was diagnosed with schizoaffective disorder.SABRA  He is currently not prescribed any medications and has no outpatient psychiatric services in place.  He works full-time and is living with his father.  He is currently on probation and wears a ankle monitor.   He denies any suicide attempts.    On evaluation Marco Snyder reports he was incarcerated for 6 years.  During that time he was medicated while in the prison and was diagnosed with schizo affective disorder.  When he was released from prison he has services in place with [facility].  However during COVID [facility] stopped taking his insurance and he stopped taking his medications at that time.  This was roughly 4 years ago.  He has been unmedicated since that time.  He notices that his mood varies.  He is easily agitated at times.  He is having difficulty at work due to agitation/anger when situations arise.  He presents today requesting to be restarted on Abilify  for mood as he felt it was effective in the past.  During evaluation Marco Snyder is observed sitting in the assessment  room in no acute distress.  He is casually dressed and makes good eye contact.  He is alert/oriented x 4, cooperative, pleasant, and attentive.  He has normal speech and behavior.  He endorses depression with feelings of irritability decreased focus, anxiety, decreased appetite, and decreased sleep.  He has a anxious affect.  He denies SI/HI.  He denies access to firearms/weapons.  He verbally contracts for safety.  He denies auditory/visual hallucinations.  He does endorse feeling paranoid and that someone is out to get him at times.  States due to being in prison he is unsure if this is normal for him.  He does not appear to be responding to internal/external stimuli.  He does not appear psychotic or manic.  He is answering questions appropriately.  Discussed open access walk-in hours with [facility] on the second floor for medication management and therapy.  However due to increment weather outpatient services are only delayed and will not have any open access walk-in's today.  Provided patient with a 7-day sample for Abilify  5 mg daily.  Explained medication including any adverse reactions.  Patient verbalizes understanding.  He agrees to present to [facility] for medication management tomorrow for open access.  At this time Marco Snyder is educated and verbalizes understanding of mental health resources and other crisis services in the community.  He is instructed to call 911 and present to the nearest emergency room should he experience any suicidal/homicidal ideation, auditory/visual/hallucinations, or detrimental worsening of he mental health condition.    Flowsheet Row ED from 05/28/2023  in [facility] ED from 04/14/2022 in [facility] ED from 04/25/2021 in [facility] RISK CATEGORY No Risk No Risk No Risk       Psychiatric Specialty Exam  Presentation  General Appearance:Appropriate for Environment;  Casual  Eye Contact:Good  Speech:Clear and Coherent  Speech Volume:Normal  Handedness:Right   Mood and Affect  Mood:Anxious; Depressed  Affect:Congruent   Thought Process  Thought Processes:Coherent  Descriptions of Associations:Intact  Orientation:Full (Time, Place and Person)  Thought Content:Paranoid Ideation    Hallucinations:None  Ideas of Reference:Paranoia  Suicidal Thoughts:No  Homicidal Thoughts:No   Sensorium  Memory:Immediate Good; Recent Good; Remote Good  Judgment:Fair  Insight:Fair   Executive Functions  Concentration:Good  Attention Span:Good  Recall:Good  Fund of Knowledge:Good  Language:Good   Psychomotor Activity  Psychomotor Activity:Normal   Assets  Assets:Housing; Physical Health; Resilience; Social Support; Manufacturing Systems Engineer; Desire for Improvement; Financial Resources/Insurance   Sleep  Sleep:Fair  Number of hours: No data recorded  Physical Exam: Physical Exam Vitals and nursing note reviewed.  Constitutional:      Appearance: Normal appearance.  Eyes:     General:        Right eye: No discharge.        Left eye: No discharge.     Conjunctiva/sclera: Conjunctivae normal.  Cardiovascular:     Rate and Rhythm: Normal rate.  Pulmonary:     Effort: Pulmonary effort is normal. No respiratory distress.  Musculoskeletal:        General: Normal range of motion.     Cervical back: Normal range of motion.  Skin:    Coloration: Skin is not jaundiced or pale.  Neurological:     Mental Status: He is alert and oriented to person, place, and time.    Review of Systems  Constitutional: Negative.   HENT: Negative.    Eyes: Negative.   Respiratory: Negative.    Cardiovascular: Negative.   Musculoskeletal: Negative.   Skin: Negative.   Neurological: Negative.   Psychiatric/Behavioral:  The patient is nervous/anxious.    Blood pressure 125/84, pulse 80, temperature 98.5 F (36.9 C), temperature source Oral,  resp. rate 16, SpO2 100%. There is no height or weight on file to calculate BMI.  Musculoskeletal: Strength & Muscle Tone: within normal limits Gait & Station: normal Patient leans: N/A   [facility] MSE Discharge Disposition for Follow up and Recommendations: Based on my evaluation the patient does not appear to have an emergency medical condition and can be discharged with resources and follow up care in outpatient services for Medication Management and Individual Therapy  Discharge patient  Provided 7-day sample for Abilify  5 mg daily.  Provided outpatient psychiatric resources for medication management and therapy.  This includes open access walk-in hours for [facility] on second floor.  Patient agrees to present tomorrow morning by 7:15 AM.  Elveria VEAR Batter, NP 05/28/2023, 4:53 PM

## 2023-06-17 ENCOUNTER — Ambulatory Visit (HOSPITAL_COMMUNITY)
Admission: EM | Admit: 2023-06-17 | Discharge: 2023-06-17 | Disposition: A | Discharge status: Home / Self Care | Payer: Self-pay | Attending: Internal Medicine | Admitting: Internal Medicine

## 2023-06-17 ENCOUNTER — Encounter (HOSPITAL_COMMUNITY): Payer: Self-pay

## 2023-06-17 DIAGNOSIS — J069 Acute upper respiratory infection, unspecified: Secondary | ICD-10-CM

## 2023-06-17 LAB — POCT INFLUENZA A/B
Influenza A, POC: NEGATIVE
Influenza B, POC: NEGATIVE

## 2023-06-17 MED ORDER — PREDNISONE 20 MG PO TABS: 40.0000 mg | ORAL_TABLET | Freq: Every day | ORAL | 0 refills | Status: AC

## 2023-06-17 MED ORDER — PROMETHAZINE-DM 6.25-15 MG/5ML PO SYRP: 5.0000 mL | ORAL_SOLUTION | Freq: Three times a day (TID) | ORAL | 0 refills | Status: AC | PRN

## 2023-06-17 NOTE — ED Triage Notes (Signed)
Here for cough,congestion and chills x 2 days. Pt thinks he has the flu.

## 2023-06-17 NOTE — ED Provider Notes (Signed)
[facility]    CSN: [id number] Arrival date & time: 06/17/23  1152      History   Chief Complaint Chief Complaint  Patient presents with   Generalized Body Aches   Fever    HPI Marco Snyder is a 38 y.o. male.   38 year old male who presents to urgent care with complaints of congestion, cough, diarrhea, body aches.  He reports his symptoms started Friday.  His girlfriend is sick as well with similar symptoms.  He reports that the body aches are severe.  He is coughing more at night.  He is having diarrhea but having no problems with eating or drinking.  He denies fevers or chills.  He denies dysuria, ear pain, rash, sore throat, shortness of breath or chest pain.   Fever Associated symptoms: congestion, cough, diarrhea and nausea   Associated symptoms: no chest pain, no chills, no dysuria, no ear pain, no rash, no sore throat and no vomiting     History reviewed. No pertinent past medical history.  There are no active problems to display for this patient.   History reviewed. No pertinent surgical history.     Home Medications    Prior to Admission medications   Medication Sig Start Date End Date Taking? Authorizing Provider  acetaminophen (TYLENOL) 500 MG tablet Take 1,000 mg by mouth every 6 (six) hours as needed for moderate pain.    [provider]  ARIPiprazole (ABILIFY) 5 MG tablet Take 1 tablet (5 mg total) by mouth daily. 05/28/23   Ardis Hughs, NP  benzonatate (TESSALON) 100 MG capsule Take 1 capsule (100 mg total) by mouth every 8 (eight) hours. 04/14/22   Carlisle Beers, FNP  buPROPion (WELLBUTRIN XL) 300 MG 24 hr tablet Take 300 mg by mouth daily.    [provider]  cyclobenzaprine (FLEXERIL) 5 MG tablet Take 1 tablet (5 mg total) by mouth in the morning and at bedtime. 04/25/21   Nasier Thumm, Elita Boone, NP  diphenhydrAMINE-PE-APAP (THERAFLU WARMING RELIEF FLU) 12.5-5-325 MG/15ML LIQD Take 30 mLs by mouth every 6 (six)  hours as needed (cough).    [provider]  divalproex (DEPAKOTE ER) 500 MG 24 hr tablet Take 500 mg by mouth daily.    [provider]  doxycycline (VIBRAMYCIN) 100 MG capsule Take 1 capsule (100 mg total) by mouth 2 (two) times daily. 11/26/20   Ivette Loyal, NP  ibuprofen (ADVIL) 800 MG tablet Take 1 tablet (800 mg total) by mouth 3 (three) times daily. 04/25/21   Valinda Hoar, NP  oseltamivir (TAMIFLU) 75 MG capsule Take 1 capsule (75 mg total) by mouth every 12 (twelve) hours. 04/25/21   Tajha Sammarco, Elita Boone, NP  Phenyleph-Doxylamine-DM-APAP (NYQUIL SEVERE COLD/FLU) 5-6.25-10-325 MG/15ML LIQD Take 30 mLs by mouth at bedtime as needed (sleep).    [provider]  triamcinolone cream (KENALOG) 0.1 % Apply 1 application topically 2 (two) times daily. 11/26/20   Ivette Loyal, NP    Family History Family History  Problem Relation Age of Onset   Cancer Mother    Healthy Father     Social History Social History   Tobacco Use   Smoking status: Every Day    Current packs/day: 1.00    Types: Cigarettes   Smokeless tobacco: Never  Vaping Use   Vaping status: Never Used  Substance Use Topics   Alcohol use: Yes   Drug use: Yes    Frequency: 7.0 times per week  Types: Marijuana     Allergies   Patient has no known allergies.   Review of Systems Review of Systems  Constitutional:  Positive for fever. Negative for chills.  HENT:  Positive for congestion. Negative for ear pain and sore throat.   Eyes:  Negative for pain and visual disturbance.  Respiratory:  Positive for cough. Negative for shortness of breath.   Cardiovascular:  Negative for chest pain and palpitations.  Gastrointestinal:  Positive for diarrhea and nausea. Negative for abdominal pain and vomiting.  Genitourinary:  Negative for dysuria and hematuria.  Musculoskeletal:  Negative for arthralgias and back pain.       Body aches   Skin:  Negative for color change and rash.   Neurological:  Negative for seizures and syncope.  All other systems reviewed and are negative.    Physical Exam Triage Vital Signs ED Triage Vitals [06/17/23 1353]  Encounter Vitals Group     BP 118/77     Systolic BP Percentile      Diastolic BP Percentile      Pulse Rate 84     Resp 18     Temp 98.9 F (37.2 C)     Temp Source Oral     SpO2 95 %     Weight      Height      Head Circumference      Peak Flow      Pain Score      Pain Loc      Pain Education      Exclude from Growth Chart    No data found.  Updated Vital Signs BP 118/77 (BP Location: Left Arm)   Pulse 84   Temp 98.9 F (37.2 C) (Oral)   Resp 18   SpO2 95%   Visual Acuity Right Eye Distance:   Left Eye Distance:   Bilateral Distance:    Right Eye Near:   Left Eye Near:    Bilateral Near:     Physical Exam Vitals and nursing note reviewed.  Constitutional:      General: He is not in acute distress.    Appearance: He is well-developed.  HENT:     Head: Normocephalic and atraumatic.     Right Ear: Tympanic membrane normal.     Left Ear: Tympanic membrane normal.  Eyes:     Conjunctiva/sclera: Conjunctivae normal.  Cardiovascular:     Rate and Rhythm: Normal rate and regular rhythm.     Heart sounds: No murmur heard. Pulmonary:     Effort: Pulmonary effort is normal. No respiratory distress.     Breath sounds: Rhonchi (faint in upper lobes) present.  Abdominal:     Palpations: Abdomen is soft.     Tenderness: There is no abdominal tenderness.  Musculoskeletal:        General: No swelling.     Cervical back: Neck supple.  Skin:    General: Skin is warm and dry.     Capillary Refill: Capillary refill takes less than 2 seconds.  Neurological:     Mental Status: He is alert.  Psychiatric:        Mood and Affect: Mood normal.      UC Treatments / Results  Labs (all labs ordered are listed, but only abnormal results are displayed) Labs Reviewed - No data to  display  EKG   Radiology No results found.  Procedures Procedures (including critical care time)  Medications Ordered in UC Medications - No data to  display  Initial Impression / Assessment and Plan / UC Course  I have reviewed the triage vital signs and the nursing notes.  Pertinent labs & imaging results that were available during my care of the patient were reviewed by me and considered in my medical decision making (see chart for details).     Viral upper respiratory tract infection with cough   Symptoms most consistent with a viral upper respiratory infection. This does not require antibiotics. Flu a and b are negative. We will treat this with the following:  Prednisone 40 mg (2 tablets) daily for 5 days. Take this in the morning. This is steroid to help with inflammation and pain. Promethazine DM 5 mL every 8 hours as needed for cough.  Use caution as this medication can cause drowsiness. Rest and stay hydrated Return to urgent care or PCP if symptoms worsen or fail to resolve.    Final Clinical Impressions(s) / UC Diagnoses   Final diagnoses:  None   Discharge Instructions   None    ED Prescriptions   None    PDMP not reviewed this encounter.   Landis Martins, [place] 06/17/23 1459

## 2023-06-17 NOTE — Discharge Instructions (Addendum)
Symptoms most consistent with a viral upper respiratory infection. This does not require antibiotics. Flu a and b are negative. We will treat this with the following:  Prednisone 40 mg (2 tablets) daily for 5 days. Take this in the morning. This is steroid to help with inflammation and pain. Promethazine DM 5 mL every 8 hours as needed for cough.  Use caution as this medication can cause drowsiness. Rest and stay hydrated Return to urgent care or PCP if symptoms worsen or fail to resolve.
